# Patient Record
Sex: Male | Born: 1960
Health system: Southern US, Community
[De-identification: ages and names within clinical notes are randomized; demographics above are authoritative.]

## PROBLEM LIST (undated history)

## (undated) DIAGNOSIS — G4730 Sleep apnea, unspecified: Secondary | ICD-10-CM

## (undated) DIAGNOSIS — E785 Hyperlipidemia, unspecified: Secondary | ICD-10-CM

## (undated) DIAGNOSIS — I10 Essential (primary) hypertension: Secondary | ICD-10-CM

## (undated) DIAGNOSIS — I4891 Unspecified atrial fibrillation: Secondary | ICD-10-CM

## (undated) HISTORY — DX: Hyperlipidemia, unspecified: E78.5

## (undated) HISTORY — PX: OTHER SURGICAL HISTORY: SHX169

## (undated) HISTORY — DX: Unspecified atrial fibrillation: I48.91

---

## 2003-04-27 ENCOUNTER — Ambulatory Visit (HOSPITAL_COMMUNITY): Admission: RE | Admit: 2003-04-27 | Discharge: 2003-04-27 | Payer: Self-pay | Admitting: Gastroenterology

## 2004-05-13 ENCOUNTER — Encounter: Admission: RE | Admit: 2004-05-13 | Discharge: 2004-05-13 | Payer: Self-pay | Admitting: Neurology

## 2004-08-21 ENCOUNTER — Encounter: Admission: RE | Admit: 2004-08-21 | Discharge: 2004-09-16 | Payer: Self-pay | Admitting: Neurology

## 2004-10-13 ENCOUNTER — Encounter: Admission: RE | Admit: 2004-10-13 | Discharge: 2005-01-11 | Payer: Self-pay | Admitting: Otolaryngology

## 2005-02-08 ENCOUNTER — Ambulatory Visit (HOSPITAL_COMMUNITY): Admission: RE | Admit: 2005-02-08 | Discharge: 2005-02-08 | Payer: Self-pay | Admitting: Orthopedic Surgery

## 2005-05-15 ENCOUNTER — Ambulatory Visit (HOSPITAL_COMMUNITY): Admission: RE | Admit: 2005-05-15 | Discharge: 2005-05-15 | Payer: Self-pay | Admitting: Orthopedic Surgery

## 2007-03-02 ENCOUNTER — Ambulatory Visit (HOSPITAL_COMMUNITY): Admission: RE | Admit: 2007-03-02 | Discharge: 2007-03-02 | Payer: Self-pay | Admitting: Specialist

## 2007-04-05 ENCOUNTER — Encounter: Admission: RE | Admit: 2007-04-05 | Discharge: 2007-04-05 | Payer: Self-pay | Admitting: Specialist

## 2008-03-09 ENCOUNTER — Ambulatory Visit: Payer: Self-pay | Admitting: Vascular Surgery

## 2008-03-09 ENCOUNTER — Encounter (INDEPENDENT_AMBULATORY_CARE_PROVIDER_SITE_OTHER): Payer: Self-pay | Admitting: Family Medicine

## 2008-03-09 ENCOUNTER — Ambulatory Visit: Admission: RE | Admit: 2008-03-09 | Discharge: 2008-03-09 | Payer: Self-pay | Admitting: Family Medicine

## 2008-05-22 ENCOUNTER — Encounter: Admission: RE | Admit: 2008-05-22 | Discharge: 2008-05-22 | Payer: Self-pay | Admitting: Endocrinology

## 2008-08-10 ENCOUNTER — Ambulatory Visit (HOSPITAL_COMMUNITY): Admission: RE | Admit: 2008-08-10 | Discharge: 2008-08-10 | Payer: Self-pay | Admitting: Internal Medicine

## 2009-06-15 IMAGING — CR DG ORBITS FOR FOREIGN BODY
2 series · 2 of 2 positions shown · non-contrast
Comparison: none

CLINICAL DATA: Pre-MRI.  History of working with metal.  
 ORBITS ? 2 VIEW:

[view not recorded (1 of 2)]
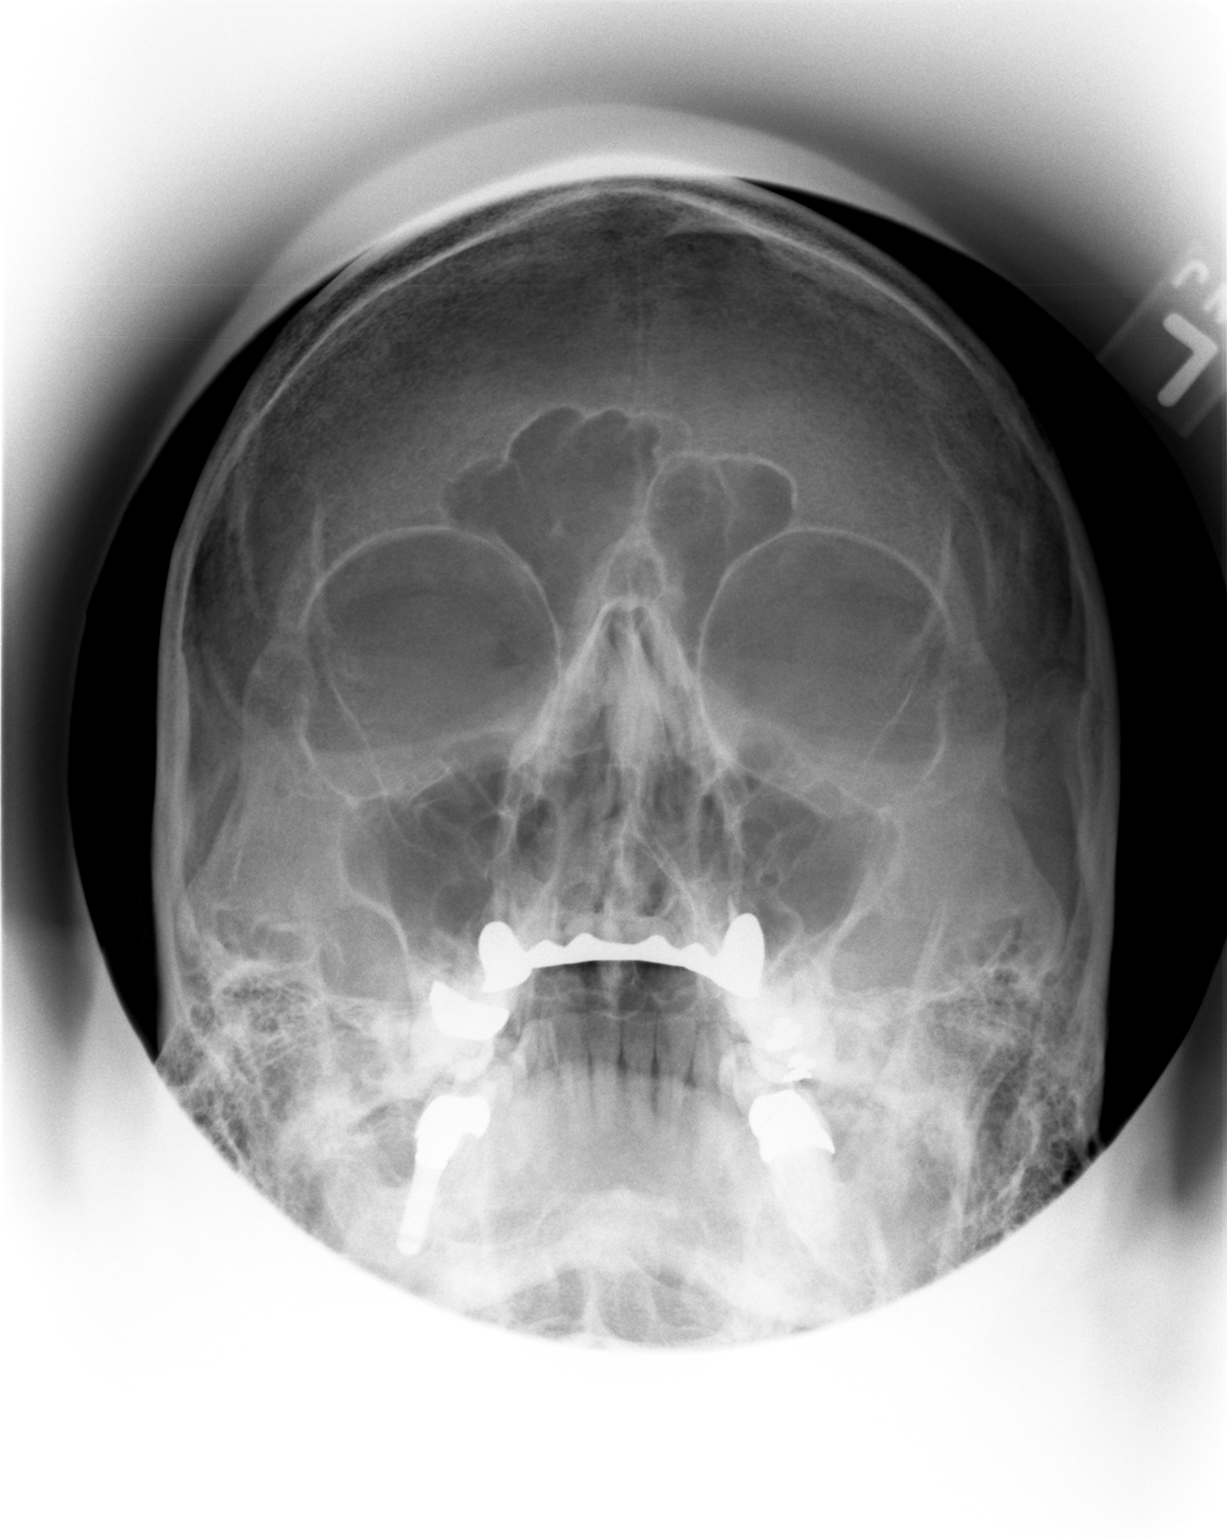

[view not recorded (2 of 2)]
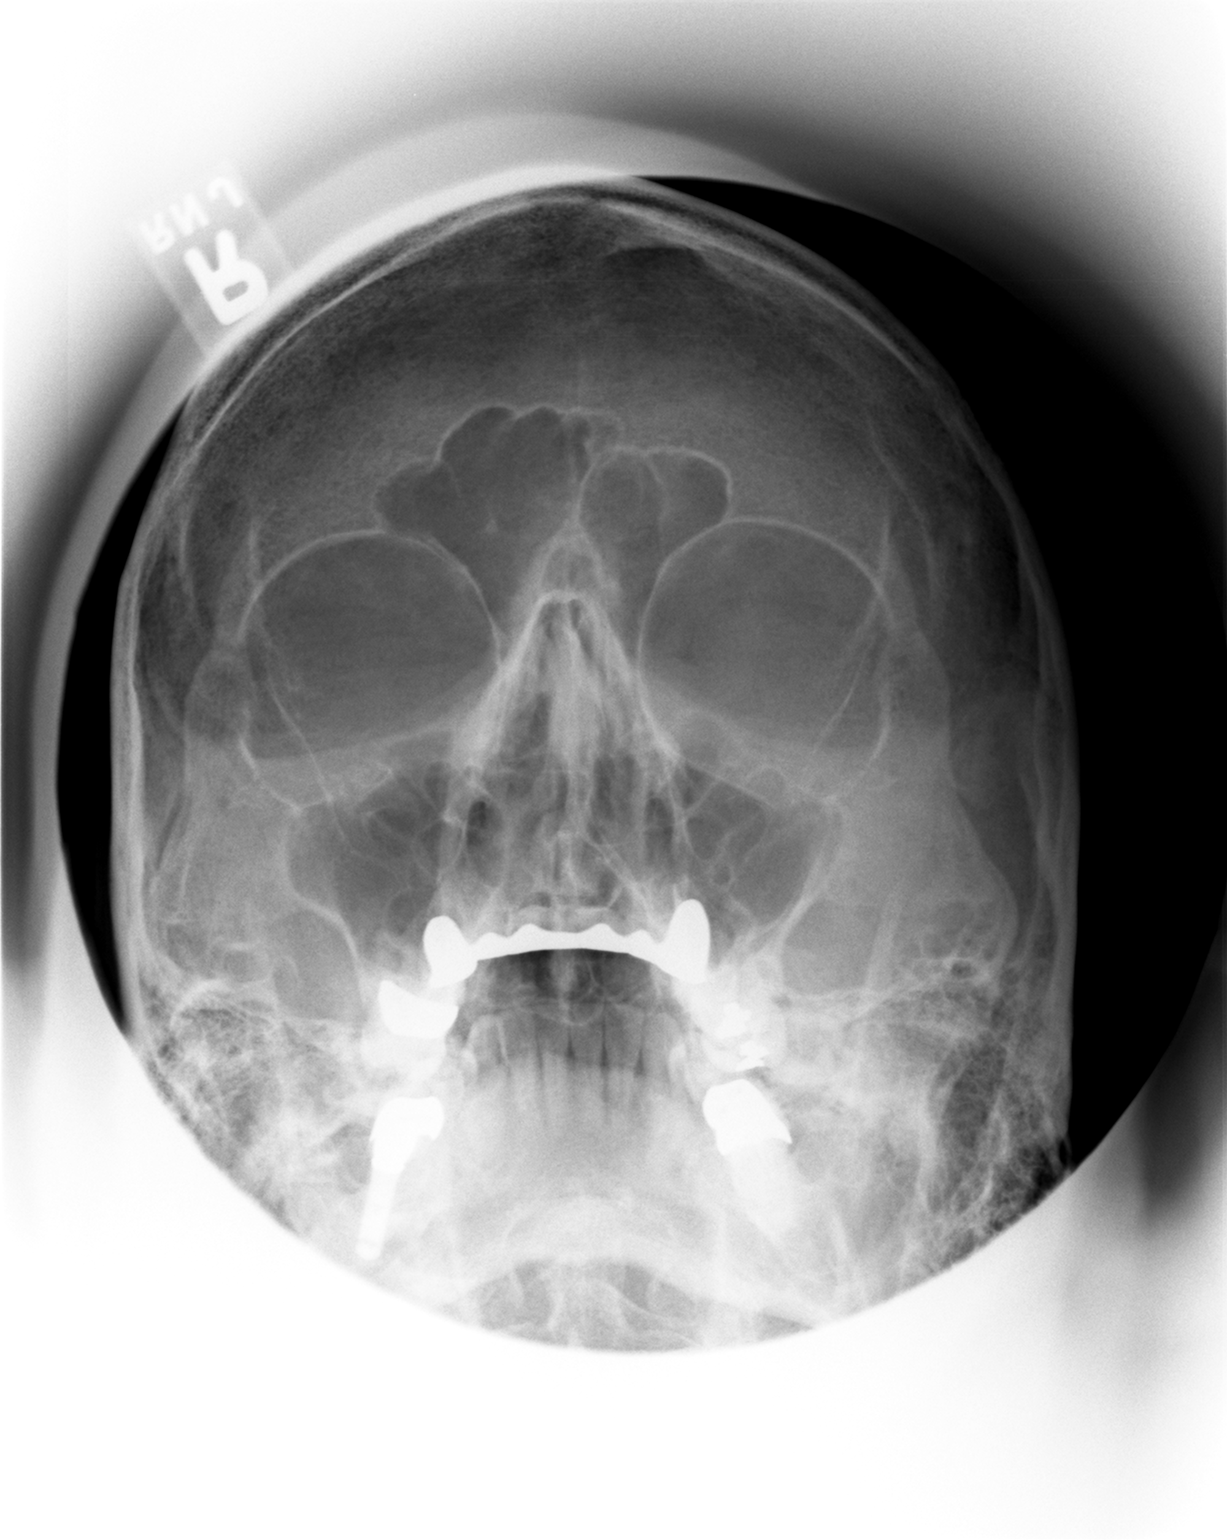

[2 of 2 positions shown; findings below may reference images not displayed]

FINDINGS: There is no evidence of metallic foreign body within the orbits.  No significant bone abnormality identified.
IMPRESSION: No evidence of metallic foreign body within the orbits.

## 2010-10-10 NOTE — Op Note (Signed)
NAMETOURE, EDMONDS                         ACCOUNT NO.:  [id number]   MEDICAL RECORD NO.:  [id number]                   PATIENT TYPE:  AMB   LOCATION:  ENDO                                 FACILITY:  [facility]   PHYSICIAN:  Danise Edge, M.D.                DATE OF BIRTH:  1960/06/26   DATE OF PROCEDURE:  04/28/2003  DATE OF DISCHARGE:                                 OPERATIVE REPORT   PROCEDURE:  Esophagogastroduodenoscopy.   INDICATIONS FOR PROCEDURE:  Mr. Todd Jacobson is a 50 year old male born  Feb 17, 1961.  Mr. Todd Jacobson has atypical chest pain.  He denies nausea,  vomiting, heartburn, indigestion, dysphagia or odynophagia.  He has been on  proton pump inhibitor therapy which has not influenced the frequency or  intensity of his atypical chest pain.  He reports no associated dyspnea or  cough.  He is an avid Counselling psychologist and runner.  He participates in a couple of  triathlons per year.  His chest pain does not appear to impair his physical  activity.   ENDOSCOPIST:  Danise Edge, M.D.   PREMEDICATION:  Versed 7.5 mg, Demerol 50 mg.   DESCRIPTION OF PROCEDURE:  After obtaining informed consent, Mr. Todd Jacobson was  placed in the left lateral decubitus position. I administered intravenous  Demerol and intravenous Versed to achieve conscious sedation for the  procedure. The patient's blood pressure, oxygen saturation and cardiac  rhythm were monitored throughout the procedure and documented in the medical  record.   The Olympus gastroscope was passed through the posterior hypopharynx into  the proximal esophagus without difficulty. The hypopharynx, larynx and vocal  cords appeared normal.   ESOPHAGOSCOPY:  Endoscopic appearance of the proximal, mid and lower  segments of the esophageal mucosa appeared normal.  The squamocolumnar  junction and the esophagogastric junction are noted at approximately 42-43  cm from the incisor teeth.  There was no endoscopic evidence for the  presence of erosive esophagitis, esophageal mucosal scarring or Barrett's  esophagus.   GASTROSCOPY:  Retroflexed view of the gastric cardia and fundus was normal.  The gastric body, antrum and pylorus appear normal.   DUODENOSCOPY:  The duodenal bulb, mid duodenum and distal duodenum appear  normal.   ASSESSMENT:  Normal esophagogastroduodenoscopy.                                               Danise Edge, M.D.    MJ/MEDQ  D:  04/27/2003  T:  04/28/2003  Job:  [id number]   cc:   Dario Guardian, M.D.  [address]  [place]  [place] [address]  Fax: [phone]

## 2013-03-06 ENCOUNTER — Other Ambulatory Visit: Payer: Self-pay | Admitting: Family Medicine

## 2013-03-09 ENCOUNTER — Other Ambulatory Visit: Payer: Self-pay

## 2013-03-13 ENCOUNTER — Other Ambulatory Visit: Payer: Self-pay

## 2013-03-14 ENCOUNTER — Ambulatory Visit
Admission: RE | Admit: 2013-03-14 | Discharge: 2013-03-14 | Disposition: A | Discharge status: Home / Self Care | Payer: 59 | Source: Ambulatory Visit | Attending: Family Medicine | Admitting: Family Medicine

## 2014-08-28 ENCOUNTER — Ambulatory Visit
Admission: RE | Admit: 2014-08-28 | Discharge: 2014-08-28 | Disposition: A | Discharge status: Home / Self Care | Payer: 59 | Source: Ambulatory Visit | Attending: Cardiology | Admitting: Cardiology

## 2014-08-28 ENCOUNTER — Other Ambulatory Visit: Payer: Self-pay | Admitting: Cardiology

## 2014-08-28 DIAGNOSIS — R079 Chest pain, unspecified: Secondary | ICD-10-CM

## 2016-02-13 ENCOUNTER — Ambulatory Visit (HOSPITAL_COMMUNITY)
Admission: RE | Admit: 2016-02-13 | Discharge: 2016-02-13 | Disposition: A | Discharge status: Home / Self Care | Payer: 59 | Source: Ambulatory Visit | Attending: Orthopedic Surgery | Admitting: Orthopedic Surgery

## 2016-02-13 ENCOUNTER — Other Ambulatory Visit (HOSPITAL_COMMUNITY): Payer: Self-pay | Admitting: Orthopedic Surgery

## 2016-02-13 DIAGNOSIS — Z01818 Encounter for other preprocedural examination: Secondary | ICD-10-CM | POA: Diagnosis not present

## 2016-02-13 DIAGNOSIS — M545 Low back pain: Secondary | ICD-10-CM

## 2016-07-15 ENCOUNTER — Other Ambulatory Visit: Payer: Self-pay | Admitting: Gastroenterology

## 2016-09-04 ENCOUNTER — Encounter (HOSPITAL_COMMUNITY): Payer: Self-pay | Admitting: *Deleted

## 2016-09-08 ENCOUNTER — Encounter (HOSPITAL_COMMUNITY)
Admission: RE | Disposition: A | Discharge status: Home / Self Care | Payer: Self-pay | Source: Ambulatory Visit | Attending: Gastroenterology

## 2016-09-08 ENCOUNTER — Ambulatory Visit (HOSPITAL_COMMUNITY): Payer: 59 | Admitting: Certified Registered Nurse Anesthetist

## 2016-09-08 ENCOUNTER — Encounter (HOSPITAL_COMMUNITY): Payer: Self-pay | Admitting: Certified Registered Nurse Anesthetist

## 2016-09-08 ENCOUNTER — Ambulatory Visit (HOSPITAL_COMMUNITY)
Admission: RE | Admit: 2016-09-08 | Discharge: 2016-09-08 | Disposition: A | Discharge status: Home / Self Care | Payer: 59 | Source: Ambulatory Visit | Attending: Gastroenterology | Admitting: Gastroenterology

## 2016-09-08 DIAGNOSIS — Z800 Family history of malignant neoplasm of digestive organs: Secondary | ICD-10-CM | POA: Diagnosis not present

## 2016-09-08 DIAGNOSIS — G4733 Obstructive sleep apnea (adult) (pediatric): Secondary | ICD-10-CM | POA: Diagnosis not present

## 2016-09-08 DIAGNOSIS — M1990 Unspecified osteoarthritis, unspecified site: Secondary | ICD-10-CM | POA: Insufficient documentation

## 2016-09-08 DIAGNOSIS — E7800 Pure hypercholesterolemia, unspecified: Secondary | ICD-10-CM | POA: Diagnosis not present

## 2016-09-08 DIAGNOSIS — Z1211 Encounter for screening for malignant neoplasm of colon: Secondary | ICD-10-CM | POA: Insufficient documentation

## 2016-09-08 DIAGNOSIS — I10 Essential (primary) hypertension: Secondary | ICD-10-CM | POA: Diagnosis not present

## 2016-09-08 HISTORY — DX: Sleep apnea, unspecified: G47.30

## 2016-09-08 HISTORY — PX: COLONOSCOPY WITH PROPOFOL: SHX5780

## 2016-09-08 HISTORY — DX: Essential (primary) hypertension: I10

## 2016-09-08 SURGERY — COLONOSCOPY WITH PROPOFOL
Anesthesia: Monitor Anesthesia Care

## 2016-09-08 MED ORDER — PROPOFOL 500 MG/50ML IV EMUL
INTRAVENOUS | Status: DC | PRN
  Administered 2016-09-08: 125 ug/kg/min via INTRAVENOUS

## 2016-09-08 MED ORDER — SODIUM CHLORIDE 0.9 % IV SOLN: INTRAVENOUS | Status: DC

## 2016-09-08 MED ORDER — LACTATED RINGERS IV SOLN: INTRAVENOUS | Status: DC

## 2016-09-08 MED ORDER — LACTATED RINGERS IV SOLN
INTRAVENOUS | Status: DC
  Administered 2016-09-08: 09:00:00 via INTRAVENOUS

## 2016-09-08 MED ORDER — PROPOFOL 500 MG/50ML IV EMUL
INTRAVENOUS | Status: DC | PRN
  Administered 2016-09-08: 60 mg via INTRAVENOUS

## 2016-09-08 SURGICAL SUPPLY — 22 items

## 2016-09-08 NOTE — H&P (Signed)
Procedure: Screening colonoscopy. Normal screening colonoscopy was performed on 11/03/2010. Mother was diagnosed with colon cancer before age 57.  History: The patient is a 56 year old male born [birth year]. He is scheduled to undergo a screening colonoscopy today.  Past medical history: Hypertension. Hypercholesterolemia. Obstructive sleep apnea. Osteoarthritis of the right hip.  Family history: Mother was diagnosed with colon cancer in her 38s.  Medication allergies: Amlodipine and OTC antihistamines.  Exam: The patient is alert and lying comfortably on the endoscopy stretcher. Abdomen is soft and nontender to palpation. Lungs are clear to auscultation. Cardiac exam reveals a regular rhythm.  Plan: Proceed with screening colonoscopy

## 2016-09-08 NOTE — Transfer of Care (Signed)
Immediate Anesthesia Transfer of Care Note  Patient: Todd Jacobson  Procedure(s) Performed: Procedure(s): COLONOSCOPY WITH PROPOFOL (N/A)  Patient Location: PACU  Anesthesia Type:MAC  Level of Consciousness: sedated, patient cooperative and responds to stimulation  Airway & Oxygen Therapy: Patient Spontanous Breathing and Patient connected to face mask oxygen  Post-op Assessment: Report given to RN and Post -op Vital signs reviewed and stable  Post vital signs: Reviewed and stable  Last Vitals:  Vitals:   09/08/16 0855 09/08/16 0940  BP: (!) 146/95   Pulse: 68 62  Resp: 18 19  Temp: 36.6 C     Last Pain:  Vitals:   09/08/16 0940  TempSrc: Oral         Complications: No apparent anesthesia complications

## 2016-09-08 NOTE — Anesthesia Postprocedure Evaluation (Signed)
Anesthesia Post Note  Patient: Todd Jacobson  Procedure(s) Performed: Procedure(s) (LRB): COLONOSCOPY WITH PROPOFOL (N/A)  Patient location during evaluation: PACU Anesthesia Type: MAC Level of consciousness: awake and alert Pain management: pain level controlled Vital Signs Assessment: post-procedure vital signs reviewed and stable Respiratory status: spontaneous breathing, nonlabored ventilation, respiratory function stable and patient connected to nasal cannula oxygen Cardiovascular status: stable and blood pressure returned to baseline Anesthetic complications: no       Last Vitals:  Vitals:   09/08/16 1000 09/08/16 1010  BP: (!) 137/92 (!) 135/94  Pulse: 60 (!) 53  Resp: 14 19  Temp:      Last Pain:  Vitals:   09/08/16 0940  TempSrc: Oral                 [name]

## 2016-09-08 NOTE — Op Note (Signed)
[facility] Patient Name: Todd Jacobson Procedure Date: 09/08/2016 MRN: [id number] Attending MD: Garlan Fair , MD Date of Birth: Feb 24, 1961 CSN: [id number] Age: 56 Admit Type: Outpatient Procedure:                Colonoscopy Indications:              Screening in patient at increased risk: Colorectal                            cancer in mother before age 41 Providers:                Garlan Fair, MD, Zenon Mayo, RN, Cherylynn Ridges, Technician, Herbie Drape, CRNA Referring MD:              Medicines:                Propofol per Anesthesia Complications:            No immediate complications. Estimated Blood Loss:     Estimated blood loss: none. Procedure:                Pre-Anesthesia Assessment:                           - Prior to the procedure, a History and Physical                            was performed, and patient medications and                            allergies were reviewed. The patient's tolerance of                            previous anesthesia was also reviewed. The risks                            and benefits of the procedure and the sedation                            options and risks were discussed with the patient.                            All questions were answered, and informed consent                            was obtained. Prior Anticoagulants: The patient has                            taken aspirin, last dose was 1 day prior to                            procedure. ASA Grade Assessment: II - A patient  with mild systemic disease. After reviewing the                            risks and benefits, the patient was deemed in                            satisfactory condition to undergo the procedure.                           After obtaining informed consent, the colonoscope                            was passed under direct vision. Throughout the   procedure, the patient's blood pressure, pulse, and                            oxygen saturations were monitored continuously. The                            EC-[id number] ([id number]) scope was introduced through                            the anus and advanced to the the cecum, identified                            by appendiceal orifice and ileocecal valve. The                            colonoscopy was performed without difficulty. The                            patient tolerated the procedure well. The quality                            of the bowel preparation was good. The terminal                            ileum, the ileocecal valve, the appendiceal orifice                            and the rectum were photographed. Scope In: 9:18:48 AM Scope Out: 9:33:51 AM Scope Withdrawal Time: 0 hours 9 minutes 22 seconds  Total Procedure Duration: 0 hours 15 minutes 3 seconds  Findings:      The perianal and digital rectal examinations were normal.      The entire examined colon appeared normal. Impression:               - The entire examined colon is normal.                           - No specimens collected. Moderate Sedation:      N/A- Per Anesthesia Care Recommendation:           - Patient has a contact number available for  emergencies. The signs and symptoms of potential                            delayed complications were discussed with the                            patient. Return to normal activities tomorrow.                            Written discharge instructions were provided to the                            patient.                           - Repeat colonoscopy in 5 years for screening                            purposes.                           - Resume previous diet.                           - Continue present medications. Procedure Code(s):        --- Professional ---                           W2993, Colorectal cancer screening; colonoscopy on                             individual at high risk Diagnosis Code(s):        --- Professional ---                           Z80.0, Family history of malignant neoplasm of                            digestive organs CPT copyright 2016 American [employer] Association. All rights reserved. The codes documented in this report are preliminary and upon coder review may  be revised to meet current compliance requirements. Earle Gell, MD Garlan Fair, MD 09/08/2016 9:40:54 AM This report has been signed electronically. Number of Addenda: 0

## 2016-09-08 NOTE — Anesthesia Preprocedure Evaluation (Signed)
Anesthesia Evaluation  Patient identified by MRN, date of birth, ID band Patient awake    Reviewed: Allergy & Precautions, H&P , NPO status , Patient's Chart, lab work & pertinent test results  Airway Mallampati: II   Neck ROM: full    Dental   Pulmonary sleep apnea ,    breath sounds clear to auscultation       Cardiovascular hypertension,  Rhythm:regular Rate:Normal     Neuro/Psych    GI/Hepatic   Endo/Other    Renal/GU      Musculoskeletal   Abdominal   Peds  Hematology   Anesthesia Other Findings   Reproductive/Obstetrics                             Anesthesia Physical Anesthesia Plan  ASA: II  Anesthesia Plan: MAC   Post-op Pain Management:    Induction: Intravenous  Airway Management Planned: Simple Face Mask  Additional Equipment:   Intra-op Plan:   Post-operative Plan:   Informed Consent: I have reviewed the patients History and Physical, chart, labs and discussed the procedure including the risks, benefits and alternatives for the proposed anesthesia with the patient or authorized representative who has indicated his/her understanding and acceptance.     Plan Discussed with: CRNA, Anesthesiologist and Surgeon  Anesthesia Plan Comments:         Anesthesia Quick Evaluation

## 2016-09-08 NOTE — Discharge Instructions (Signed)
YOU HAD AN ENDOSCOPIC PROCEDURE TODAY: Refer to the procedure report and other information in the discharge instructions given to you for any specific questions about what was found during the examination. If this information does not answer your questions, please call Dr. Wynetta Emery office at [phone] to clarify.   YOU SHOULD EXPECT: Some feelings of bloating in the abdomen. Passage of more gas than usual. Walking can help get rid of the air that was put into your GI tract during the procedure and reduce the bloating. If you had a lower endoscopy (such as a colonoscopy or flexible sigmoidoscopy) you may notice spotting of blood in your stool or on the toilet paper. Some abdominal soreness may be present for a day or two, also.  DIET: Your first meal following the procedure should be a light meal and then it is ok to progress to your normal diet. A half-sandwich or bowl of soup is an example of a good first meal. Heavy or fried foods are harder to digest and may make you feel nauseous or bloated. Drink plenty of fluids but you should avoid alcoholic beverages for 24 hours. If you had a esophageal dilation, please see attached instructions for diet.   ACTIVITY: Your care partner should take you home directly after the procedure. You should plan to take it easy, moving slowly for the rest of the day. You can resume normal activity the day after the procedure however YOU SHOULD NOT DRIVE, use power tools, machinery or perform tasks that involve climbing or major physical exertion for 24 hours (because of the sedation medicines used during the test).   SYMPTOMS TO REPORT IMMEDIATELY: A gastroenterologist can be reached at any hour. Please call [phone] for any of the following symptoms:  Following lower endoscopy (colonoscopy, flexible sigmoidoscopy) Excessive amounts of blood in the stool  Significant tenderness, worsening of abdominal pains  Swelling of the abdomen that is new, acute  Fever of 100  or higher  Following upper endoscopy (EGD, EUS, ERCP, esophageal dilation) Vomiting of blood or coffee ground material  New, significant abdominal pain  New, significant chest pain or pain under the shoulder blades  Painful or persistently difficult swallowing  New shortness of breath  Black, tarry-looking or red, bloody stools  FOLLOW UP:  If any biopsies were taken you will be contacted by phone or by letter within the next 1-3 weeks. Call [phone]  if you have not heard about the biopsies in 3 weeks.  Please also call with any specific questions about appointments or follow up tests.  YOU HAD AN ENDOSCOPIC PROCEDURE TODAY: Refer to the procedure report and other information in the discharge instructions given to you for any specific questions about what was found during the examination. If this information does not answer your questions, please call Dr. Wynetta Emery office at [phone] to clarify.   YOU SHOULD EXPECT: Some feelings of bloating in the abdomen. Passage of more gas than usual. Walking can help get rid of the air that was put into your GI tract during the procedure and reduce the bloating. If you had a lower endoscopy (such as a colonoscopy or flexible sigmoidoscopy) you may notice spotting of blood in your stool or on the toilet paper. Some abdominal soreness may be present for a day or two, also.  DIET: Your first meal following the procedure should be a light meal and then it is ok to progress to your normal diet. A half-sandwich or bowl of soup is an example  of a good first meal. Heavy or fried foods are harder to digest and may make you feel nauseous or bloated. Drink plenty of fluids but you should avoid alcoholic beverages for 24 hours. If you had a esophageal dilation, please see attached instructions for diet.   ACTIVITY: Your care partner should take you home directly after the procedure. You should plan to take it easy, moving slowly for the rest of the day. You can  resume normal activity the day after the procedure however YOU SHOULD NOT DRIVE, use power tools, machinery or perform tasks that involve climbing or major physical exertion for 24 hours (because of the sedation medicines used during the test).   SYMPTOMS TO REPORT IMMEDIATELY: A gastroenterologist can be reached at any hour. Please call [phone] for any of the following symptoms:  Following lower endoscopy (colonoscopy, flexible sigmoidoscopy) Excessive amounts of blood in the stool  Significant tenderness, worsening of abdominal pains  Swelling of the abdomen that is new, acute  Fever of 100 or higher  Following upper endoscopy (EGD, EUS, ERCP, esophageal dilation) Vomiting of blood or coffee ground material  New, significant abdominal pain  New, significant chest pain or pain under the shoulder blades  Painful or persistently difficult swallowing  New shortness of breath  Black, tarry-looking or red, bloody stools  FOLLOW UP:  If any biopsies were taken you will be contacted by phone or by letter within the next 1-3 weeks. Call [phone]  if you have not heard about the biopsies in 3 weeks.  Please also call with any specific questions about appointments or follow up tests.

## 2016-09-09 ENCOUNTER — Encounter (HOSPITAL_COMMUNITY): Payer: Self-pay | Admitting: Gastroenterology

## 2016-10-28 ENCOUNTER — Other Ambulatory Visit: Payer: Self-pay

## 2016-10-28 ENCOUNTER — Encounter (HOSPITAL_COMMUNITY): Payer: Self-pay

## 2016-10-28 ENCOUNTER — Emergency Department (HOSPITAL_COMMUNITY): Payer: 59

## 2016-10-28 ENCOUNTER — Emergency Department (HOSPITAL_COMMUNITY)
Admission: EM | Admit: 2016-10-28 | Discharge: 2016-10-28 | Disposition: A | Discharge status: Home / Self Care | Payer: 59 | Attending: Emergency Medicine | Admitting: Emergency Medicine

## 2016-10-28 DIAGNOSIS — R002 Palpitations: Secondary | ICD-10-CM | POA: Diagnosis present

## 2016-10-28 DIAGNOSIS — I10 Essential (primary) hypertension: Secondary | ICD-10-CM | POA: Insufficient documentation

## 2016-10-28 DIAGNOSIS — Z79899 Other long term (current) drug therapy: Secondary | ICD-10-CM | POA: Insufficient documentation

## 2016-10-28 DIAGNOSIS — I4891 Unspecified atrial fibrillation: Secondary | ICD-10-CM | POA: Diagnosis not present

## 2016-10-28 LAB — CBC
HCT: 41.8 % (ref 39.0–52.0)
Hemoglobin: 14.6 g/dL (ref 13.0–17.0)
MCH: 31.7 pg (ref 26.0–34.0)
MCHC: 34.9 g/dL (ref 30.0–36.0)
MCV: 90.7 fL (ref 78.0–100.0)
PLATELETS: 247 10*3/uL (ref 150–400)
RBC: 4.61 MIL/uL (ref 4.22–5.81)
RDW: 12.8 % (ref 11.5–15.5)
WBC: 10.7 10*3/uL — AB (ref 4.0–10.5)

## 2016-10-28 LAB — BASIC METABOLIC PANEL
Anion gap: 10 (ref 5–15)
BUN: 14 mg/dL (ref 6–20)
CALCIUM: 9.2 mg/dL (ref 8.9–10.3)
CO2: 24 mmol/L (ref 22–32)
CREATININE: 1.16 mg/dL (ref 0.61–1.24)
Chloride: 103 mmol/L (ref 101–111)
Glucose, Bld: 100 mg/dL — ABNORMAL HIGH (ref 65–99)
Potassium: 3.2 mmol/L — ABNORMAL LOW (ref 3.5–5.1)
SODIUM: 137 mmol/L (ref 135–145)

## 2016-10-28 LAB — I-STAT TROPONIN, ED: Troponin i, poc: 0 ng/mL (ref 0.00–0.08)

## 2016-10-28 LAB — TSH: TSH: 4.847 u[IU]/mL — AB (ref 0.350–4.500)

## 2016-10-28 LAB — MAGNESIUM: MAGNESIUM: 2 mg/dL (ref 1.7–2.4)

## 2016-10-28 MED ORDER — HEPARIN (PORCINE) IN NACL 100-0.45 UNIT/ML-% IJ SOLN
1300.0000 [IU]/h | INTRAMUSCULAR | Status: DC
  Filled 2016-10-28: qty 250

## 2016-10-28 MED ORDER — RIVAROXABAN (XARELTO) EDUCATION KIT FOR AFIB PATIENTS
PACK | Freq: Once | Status: DC
  Filled 2016-10-28: qty 1

## 2016-10-28 MED ORDER — DILTIAZEM HCL 30 MG PO TABS: 30.0000 mg | ORAL_TABLET | Freq: Four times a day (QID) | ORAL | 0 refills | Status: DC

## 2016-10-28 MED ORDER — RIVAROXABAN 20 MG PO TABS
20.0000 mg | ORAL_TABLET | Freq: Once | ORAL | Status: AC
  Administered 2016-10-28: 20 mg via ORAL
  Filled 2016-10-28: qty 1

## 2016-10-28 MED ORDER — POTASSIUM CHLORIDE CRYS ER 20 MEQ PO TBCR: 40.0000 meq | EXTENDED_RELEASE_TABLET | Freq: Two times a day (BID) | ORAL | 0 refills | Status: DC

## 2016-10-28 MED ORDER — RIVAROXABAN 20 MG PO TABS: 20.0000 mg | ORAL_TABLET | Freq: Every day | ORAL | 0 refills | Status: DC

## 2016-10-28 MED ORDER — DILTIAZEM HCL 100 MG IV SOLR
5.0000 mg/h | INTRAVENOUS | Status: DC
  Administered 2016-10-28: 5 mg/h via INTRAVENOUS
  Filled 2016-10-28: qty 100

## 2016-10-28 MED ORDER — LACTATED RINGERS IV BOLUS (SEPSIS)
1000.0000 mL | Freq: Once | INTRAVENOUS | Status: AC
  Administered 2016-10-28: 1000 mL via INTRAVENOUS

## 2016-10-28 MED ORDER — DILTIAZEM HCL 30 MG PO TABS
30.0000 mg | ORAL_TABLET | Freq: Once | ORAL | Status: AC
  Administered 2016-10-28: 30 mg via ORAL
  Filled 2016-10-28: qty 1

## 2016-10-28 MED ORDER — POTASSIUM CHLORIDE CRYS ER 20 MEQ PO TBCR
40.0000 meq | EXTENDED_RELEASE_TABLET | Freq: Once | ORAL | Status: AC
  Administered 2016-10-28: 40 meq via ORAL
  Filled 2016-10-28: qty 2

## 2016-10-28 MED ORDER — SODIUM CHLORIDE 0.9 % IV BOLUS (SEPSIS)
1000.0000 mL | Freq: Once | INTRAVENOUS | Status: AC
  Administered 2016-10-28: 1000 mL via INTRAVENOUS

## 2016-10-28 MED ORDER — DILTIAZEM LOAD VIA INFUSION
10.0000 mg | Freq: Once | INTRAVENOUS | Status: AC
  Administered 2016-10-28: 10 mg via INTRAVENOUS
  Filled 2016-10-28: qty 10

## 2016-10-28 MED ORDER — HEPARIN BOLUS VIA INFUSION
4000.0000 [IU] | Freq: Once | INTRAVENOUS | Status: DC
  Filled 2016-10-28: qty 4000

## 2016-10-28 NOTE — Progress Notes (Signed)
ANTICOAGULATION CONSULT NOTE - Initial Consult  Pharmacy Consult for heparin  Indication: atrial fibrillation  No Known Allergies  Patient Measurements: Height: 5\' 10"  (177.8 cm) Weight: 195 lb (88.5 kg) IBW/kg (Calculated) : 73 Heparin Dosing Weight:  89 kg   Vital Signs: Temp: 98.4 F (36.9 C) (06/06 1855) Temp Source: Oral (06/06 1855) BP: 121/79 (06/06 1930) Pulse Rate: 50 (06/06 1930)  Labs:  Recent Labs  10/28/16 1930  HGB 14.6  HCT 41.8  PLT 247    CrCl cannot be calculated (No order found.).  Medical History: Past Medical History:  Diagnosis Date  . Hypertension   . Sleep apnea    Assessment: 56 yo male admitted with palpitations and found be in afib. Not on oral anticoagulation PTA. Pharmacy consulted to dose heparin. CBC stable and no overt s/s bleeding noted.   Goal of Therapy:  Heparin level 0.3-0.7 units/ml Monitor platelets by anticoagulation protocol: Yes   Plan:  Heparin 4000 units x1, then start infusion at 1300 units/hr Heparin level in 6 hours  Daily heparin level and CBC Monitor for s/s bleeding Follow-up long term [facility] plans   Argie Ramming, PharmD Pharmacy Resident  Pager [phone] 10/28/16 8:04 PM

## 2016-10-28 NOTE — ED Notes (Addendum)
Patient transported to X-ray 

## 2016-10-28 NOTE — ED Notes (Signed)
ED Provider at bedside. 

## 2016-10-28 NOTE — ED Notes (Signed)
Pt departed in NAD, refused use of wheelchair.  
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
## 2016-10-28 NOTE — ED Triage Notes (Signed)
[facility] where pt went after cutting grass at home today and having near syncope.  Pt c/o palpitations and feeling that heart was racing but no chest pain. No hx of afib.

## 2016-10-28 NOTE — ED Notes (Signed)
Ambulated in hallway. Pt walked without asstance and had smooth and steady gait.

## 2016-10-28 NOTE — Discharge Instructions (Signed)
Follow-up in the AFib clinic within 3 days Take the diltiazem as prescribed Drink plenty of water If you develop lightheadedness or low blood pressure, hold your diltiazem and seek medical attention

## 2016-11-02 ENCOUNTER — Encounter (HOSPITAL_COMMUNITY): Payer: Self-pay | Admitting: Nurse Practitioner

## 2016-11-02 ENCOUNTER — Ambulatory Visit (HOSPITAL_COMMUNITY)
Admission: RE | Admit: 2016-11-02 | Discharge: 2016-11-02 | Disposition: A | Discharge status: Home / Self Care | Payer: 59 | Source: Ambulatory Visit | Attending: Nurse Practitioner | Admitting: Nurse Practitioner

## 2016-11-02 VITALS — BP 110/72 | HR 70 | Ht 70.0 in | Wt 198.0 lb

## 2016-11-02 DIAGNOSIS — Z7982 Long term (current) use of aspirin: Secondary | ICD-10-CM | POA: Insufficient documentation

## 2016-11-02 DIAGNOSIS — I480 Paroxysmal atrial fibrillation: Secondary | ICD-10-CM

## 2016-11-02 DIAGNOSIS — Z79899 Other long term (current) drug therapy: Secondary | ICD-10-CM | POA: Insufficient documentation

## 2016-11-02 DIAGNOSIS — Z888 Allergy status to other drugs, medicaments and biological substances status: Secondary | ICD-10-CM | POA: Diagnosis not present

## 2016-11-02 DIAGNOSIS — Z7901 Long term (current) use of anticoagulants: Secondary | ICD-10-CM | POA: Diagnosis not present

## 2016-11-02 DIAGNOSIS — I4891 Unspecified atrial fibrillation: Secondary | ICD-10-CM | POA: Diagnosis not present

## 2016-11-02 DIAGNOSIS — I10 Essential (primary) hypertension: Secondary | ICD-10-CM | POA: Insufficient documentation

## 2016-11-02 MED ORDER — DILTIAZEM HCL 30 MG PO TABS: ORAL_TABLET | ORAL | 0 refills | Status: DC

## 2016-11-02 MED ORDER — DILTIAZEM HCL ER COATED BEADS 120 MG PO CP24: 120.0000 mg | ORAL_CAPSULE | Freq: Every day | ORAL | 6 refills | Status: DC

## 2016-11-02 NOTE — Patient Instructions (Signed)
Your physician has recommended you make the following change in your medication:  1)Stop aspirin while taking Xarelto -- resume once you have completed the prescription for Xarelto   2)Start Cardizem CD 120mg  once a day  Use the 30mg  tablets of cardizem only as needed for afib/palpitations
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
## 2016-11-12 ENCOUNTER — Ambulatory Visit (HOSPITAL_COMMUNITY)
Admission: RE | Admit: 2016-11-12 | Discharge: 2016-11-12 | Disposition: A | Discharge status: Home / Self Care | Payer: 59 | Source: Ambulatory Visit | Attending: Nurse Practitioner | Admitting: Nurse Practitioner

## 2016-11-12 DIAGNOSIS — I10 Essential (primary) hypertension: Secondary | ICD-10-CM | POA: Diagnosis not present

## 2016-11-12 DIAGNOSIS — I480 Paroxysmal atrial fibrillation: Secondary | ICD-10-CM

## 2016-11-12 NOTE — Progress Notes (Signed)
  Echocardiogram 2D Echocardiogram has been performed.  Abigial Newville 11/12/2016, 1:46 PM

## 2016-12-04 ENCOUNTER — Other Ambulatory Visit: Payer: Self-pay | Admitting: *Deleted

## 2016-12-04 MED ORDER — DILTIAZEM HCL ER COATED BEADS 120 MG PO CP24: 120.0000 mg | ORAL_CAPSULE | Freq: Every day | ORAL | 1 refills | Status: DC

## 2016-12-04 NOTE — Telephone Encounter (Signed)
Received fax from [facility] requesting refills on patients diltiazem.

## 2016-12-22 ENCOUNTER — Encounter (HOSPITAL_COMMUNITY): Payer: Self-pay | Admitting: Nurse Practitioner

## 2016-12-22 ENCOUNTER — Ambulatory Visit (HOSPITAL_COMMUNITY)
Admission: RE | Admit: 2016-12-22 | Discharge: 2016-12-22 | Disposition: A | Discharge status: Home / Self Care | Payer: 59 | Source: Ambulatory Visit | Attending: Nurse Practitioner | Admitting: Nurse Practitioner

## 2016-12-22 VITALS — BP 114/72 | HR 70 | Ht 70.0 in | Wt 200.6 lb

## 2016-12-22 DIAGNOSIS — I10 Essential (primary) hypertension: Secondary | ICD-10-CM | POA: Insufficient documentation

## 2016-12-22 DIAGNOSIS — I4891 Unspecified atrial fibrillation: Secondary | ICD-10-CM | POA: Diagnosis not present

## 2016-12-22 DIAGNOSIS — Z7982 Long term (current) use of aspirin: Secondary | ICD-10-CM | POA: Diagnosis not present

## 2016-12-22 DIAGNOSIS — R42 Dizziness and giddiness: Secondary | ICD-10-CM | POA: Diagnosis not present

## 2016-12-22 DIAGNOSIS — Z9889 Other specified postprocedural states: Secondary | ICD-10-CM | POA: Insufficient documentation

## 2016-12-22 DIAGNOSIS — I480 Paroxysmal atrial fibrillation: Secondary | ICD-10-CM

## 2016-12-22 DIAGNOSIS — Z79899 Other long term (current) drug therapy: Secondary | ICD-10-CM | POA: Insufficient documentation

## 2016-12-22 LAB — CBC
HEMATOCRIT: 42.6 % (ref 39.0–52.0)
Hemoglobin: 14.8 g/dL (ref 13.0–17.0)
MCH: 30.8 pg (ref 26.0–34.0)
MCHC: 34.7 g/dL (ref 30.0–36.0)
MCV: 88.8 fL (ref 78.0–100.0)
Platelets: 230 10*3/uL (ref 150–400)
RBC: 4.8 MIL/uL (ref 4.22–5.81)
RDW: 12.6 % (ref 11.5–15.5)
WBC: 8.3 10*3/uL (ref 4.0–10.5)

## 2016-12-22 LAB — BASIC METABOLIC PANEL
Anion gap: 10 (ref 5–15)
BUN: 12 mg/dL (ref 6–20)
CO2: 25 mmol/L (ref 22–32)
CREATININE: 1.1 mg/dL (ref 0.61–1.24)
Calcium: 9.4 mg/dL (ref 8.9–10.3)
Chloride: 100 mmol/L — ABNORMAL LOW (ref 101–111)
GFR calc non Af Amer: >60 mL/min (ref >60)
GLUCOSE: 96 mg/dL (ref 65–99)
Potassium: 3.4 mmol/L — ABNORMAL LOW (ref 3.5–5.1)
Sodium: 135 mmol/L (ref 135–145)

## 2016-12-22 LAB — TSH: TSH: 3.411 u[IU]/mL (ref 0.350–4.500)

## 2016-12-22 MED ORDER — AMLODIPINE BESYLATE 5 MG PO TABS: 5.0000 mg | ORAL_TABLET | Freq: Every day | ORAL | 3 refills | Status: DC
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
## 2016-12-22 NOTE — Patient Instructions (Signed)
STOP diltiazem  START Amlodipine 5 mg taking one tablet daily-this was sent to your pharmacy  You have an appt scheduled with Dr. Wynonia Lawman, Monday November 5th at 10:45 a.m. If you are unable to keep this appt, please call their office

## 2016-12-23 ENCOUNTER — Other Ambulatory Visit (HOSPITAL_COMMUNITY): Payer: Self-pay | Admitting: *Deleted

## 2016-12-23 LAB — T3: T3, Total: 121 ng/dL (ref 71–180)

## 2016-12-23 MED ORDER — POTASSIUM CHLORIDE ER 10 MEQ PO TBCR: 10.0000 meq | EXTENDED_RELEASE_TABLET | Freq: Every day | ORAL | 6 refills | Status: DC

## 2017-03-29 ENCOUNTER — Encounter: Payer: Self-pay | Admitting: Cardiology

## 2017-03-29 DIAGNOSIS — I10 Essential (primary) hypertension: Secondary | ICD-10-CM | POA: Insufficient documentation

## 2017-03-29 DIAGNOSIS — I480 Paroxysmal atrial fibrillation: Secondary | ICD-10-CM | POA: Insufficient documentation

## 2017-03-29 DIAGNOSIS — G4730 Sleep apnea, unspecified: Secondary | ICD-10-CM | POA: Insufficient documentation

## 2017-05-26 DIAGNOSIS — K219 Gastro-esophageal reflux disease without esophagitis: Secondary | ICD-10-CM | POA: Diagnosis not present

## 2017-05-26 DIAGNOSIS — I10 Essential (primary) hypertension: Secondary | ICD-10-CM | POA: Diagnosis not present

## 2017-05-26 DIAGNOSIS — Z0000 Encounter for general adult medical examination without abnormal findings: Secondary | ICD-10-CM | POA: Diagnosis not present

## 2017-05-26 DIAGNOSIS — Z23 Encounter for immunization: Secondary | ICD-10-CM | POA: Diagnosis not present

## 2017-05-26 DIAGNOSIS — Z125 Encounter for screening for malignant neoplasm of prostate: Secondary | ICD-10-CM | POA: Diagnosis not present

## 2017-05-26 DIAGNOSIS — E782 Mixed hyperlipidemia: Secondary | ICD-10-CM | POA: Diagnosis not present

## 2017-06-25 DIAGNOSIS — H538 Other visual disturbances: Secondary | ICD-10-CM | POA: Diagnosis not present

## 2017-06-28 DIAGNOSIS — H43812 Vitreous degeneration, left eye: Secondary | ICD-10-CM | POA: Diagnosis not present

## 2017-06-28 DIAGNOSIS — H25813 Combined forms of age-related cataract, bilateral: Secondary | ICD-10-CM | POA: Diagnosis not present

## 2017-07-12 DIAGNOSIS — H52223 Regular astigmatism, bilateral: Secondary | ICD-10-CM | POA: Diagnosis not present

## 2017-07-12 DIAGNOSIS — H524 Presbyopia: Secondary | ICD-10-CM | POA: Diagnosis not present

## 2017-07-12 DIAGNOSIS — H5213 Myopia, bilateral: Secondary | ICD-10-CM | POA: Diagnosis not present

## 2017-07-14 ENCOUNTER — Other Ambulatory Visit (HOSPITAL_COMMUNITY): Payer: Self-pay | Admitting: Nurse Practitioner

## 2017-08-17 DIAGNOSIS — C44319 Basal cell carcinoma of skin of other parts of face: Secondary | ICD-10-CM | POA: Diagnosis not present

## 2017-11-08 DIAGNOSIS — K219 Gastro-esophageal reflux disease without esophagitis: Secondary | ICD-10-CM | POA: Diagnosis not present

## 2017-11-08 DIAGNOSIS — E782 Mixed hyperlipidemia: Secondary | ICD-10-CM | POA: Diagnosis not present

## 2017-11-08 DIAGNOSIS — I10 Essential (primary) hypertension: Secondary | ICD-10-CM | POA: Diagnosis not present

## 2017-12-01 ENCOUNTER — Other Ambulatory Visit (HOSPITAL_COMMUNITY): Payer: Self-pay | Admitting: Nurse Practitioner

## 2017-12-08 DIAGNOSIS — Z86018 Personal history of other benign neoplasm: Secondary | ICD-10-CM | POA: Diagnosis not present

## 2017-12-08 DIAGNOSIS — Z85828 Personal history of other malignant neoplasm of skin: Secondary | ICD-10-CM | POA: Diagnosis not present

## 2017-12-08 DIAGNOSIS — L570 Actinic keratosis: Secondary | ICD-10-CM | POA: Diagnosis not present

## 2017-12-08 DIAGNOSIS — D225 Melanocytic nevi of trunk: Secondary | ICD-10-CM | POA: Diagnosis not present

## 2017-12-22 DIAGNOSIS — G4733 Obstructive sleep apnea (adult) (pediatric): Secondary | ICD-10-CM | POA: Diagnosis not present

## 2018-01-04 DIAGNOSIS — G4733 Obstructive sleep apnea (adult) (pediatric): Secondary | ICD-10-CM | POA: Diagnosis not present

## 2018-02-04 DIAGNOSIS — G4733 Obstructive sleep apnea (adult) (pediatric): Secondary | ICD-10-CM | POA: Diagnosis not present

## 2018-03-03 ENCOUNTER — Other Ambulatory Visit (HOSPITAL_COMMUNITY): Payer: Self-pay | Admitting: Nurse Practitioner

## 2018-03-06 DIAGNOSIS — G4733 Obstructive sleep apnea (adult) (pediatric): Secondary | ICD-10-CM | POA: Diagnosis not present

## 2018-03-14 DIAGNOSIS — Z23 Encounter for immunization: Secondary | ICD-10-CM | POA: Diagnosis not present

## 2018-03-23 DIAGNOSIS — G4733 Obstructive sleep apnea (adult) (pediatric): Secondary | ICD-10-CM | POA: Diagnosis not present

## 2018-03-28 ENCOUNTER — Telehealth: Payer: Self-pay | Admitting: Cardiology

## 2018-03-28 NOTE — Telephone Encounter (Signed)
none

## 2018-04-06 DIAGNOSIS — G4733 Obstructive sleep apnea (adult) (pediatric): Secondary | ICD-10-CM | POA: Diagnosis not present

## 2018-05-06 DIAGNOSIS — G4733 Obstructive sleep apnea (adult) (pediatric): Secondary | ICD-10-CM | POA: Diagnosis not present

## 2018-06-02 DIAGNOSIS — E782 Mixed hyperlipidemia: Secondary | ICD-10-CM | POA: Diagnosis not present

## 2018-06-02 DIAGNOSIS — Z0000 Encounter for general adult medical examination without abnormal findings: Secondary | ICD-10-CM | POA: Diagnosis not present

## 2018-06-05 ENCOUNTER — Other Ambulatory Visit (HOSPITAL_COMMUNITY): Payer: Self-pay | Admitting: Nurse Practitioner

## 2018-06-06 DIAGNOSIS — G4733 Obstructive sleep apnea (adult) (pediatric): Secondary | ICD-10-CM | POA: Diagnosis not present

## 2018-07-07 DIAGNOSIS — G4733 Obstructive sleep apnea (adult) (pediatric): Secondary | ICD-10-CM | POA: Diagnosis not present

## 2018-07-13 DIAGNOSIS — L570 Actinic keratosis: Secondary | ICD-10-CM | POA: Diagnosis not present

## 2018-07-13 DIAGNOSIS — D2272 Melanocytic nevi of left lower limb, including hip: Secondary | ICD-10-CM | POA: Diagnosis not present

## 2018-07-13 DIAGNOSIS — D2271 Melanocytic nevi of right lower limb, including hip: Secondary | ICD-10-CM | POA: Diagnosis not present

## 2018-07-13 DIAGNOSIS — D225 Melanocytic nevi of trunk: Secondary | ICD-10-CM | POA: Diagnosis not present

## 2018-07-21 DIAGNOSIS — H5213 Myopia, bilateral: Secondary | ICD-10-CM | POA: Diagnosis not present

## 2018-08-05 DIAGNOSIS — G4733 Obstructive sleep apnea (adult) (pediatric): Secondary | ICD-10-CM | POA: Diagnosis not present

## 2018-09-05 DIAGNOSIS — G4733 Obstructive sleep apnea (adult) (pediatric): Secondary | ICD-10-CM | POA: Diagnosis not present

## 2018-09-12 DIAGNOSIS — M25512 Pain in left shoulder: Secondary | ICD-10-CM | POA: Diagnosis not present

## 2018-09-12 DIAGNOSIS — M7542 Impingement syndrome of left shoulder: Secondary | ICD-10-CM | POA: Diagnosis not present

## 2018-10-05 DIAGNOSIS — G4733 Obstructive sleep apnea (adult) (pediatric): Secondary | ICD-10-CM | POA: Diagnosis not present

## 2021-08-05 ENCOUNTER — Other Ambulatory Visit: Payer: Self-pay | Admitting: Family Medicine

## 2021-08-05 DIAGNOSIS — Z8781 Personal history of (healed) traumatic fracture: Secondary | ICD-10-CM

## 2021-12-26 ENCOUNTER — Other Ambulatory Visit: Payer: Self-pay | Admitting: Family Medicine

## 2021-12-26 DIAGNOSIS — Z8781 Personal history of (healed) traumatic fracture: Secondary | ICD-10-CM

## 2022-06-24 ENCOUNTER — Ambulatory Visit
Admission: RE | Admit: 2022-06-24 | Discharge: 2022-06-24 | Disposition: A | Discharge status: Home / Self Care | Payer: 59 | Source: Ambulatory Visit | Attending: Family Medicine | Admitting: Family Medicine

## 2022-06-24 DIAGNOSIS — Z8781 Personal history of (healed) traumatic fracture: Secondary | ICD-10-CM

## 2022-08-04 ENCOUNTER — Ambulatory Visit: Payer: 59 | Admitting: Cardiology

## 2022-08-04 ENCOUNTER — Encounter: Payer: Self-pay | Admitting: Cardiology

## 2022-08-04 VITALS — BP 136/88 | HR 74 | Resp 16 | Ht 70.0 in | Wt 201.8 lb

## 2022-08-04 DIAGNOSIS — I10 Essential (primary) hypertension: Secondary | ICD-10-CM

## 2022-08-04 DIAGNOSIS — G4730 Sleep apnea, unspecified: Secondary | ICD-10-CM

## 2022-08-04 DIAGNOSIS — E782 Mixed hyperlipidemia: Secondary | ICD-10-CM

## 2022-08-04 DIAGNOSIS — F1010 Alcohol abuse, uncomplicated: Secondary | ICD-10-CM

## 2022-08-04 DIAGNOSIS — I480 Paroxysmal atrial fibrillation: Secondary | ICD-10-CM
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
## 2022-08-13 ENCOUNTER — Ambulatory Visit: Payer: 59

## 2022-08-13 DIAGNOSIS — I480 Paroxysmal atrial fibrillation: Secondary | ICD-10-CM

## 2022-08-27 NOTE — Progress Notes (Signed)
LMTCB

## 2022-08-28 NOTE — Progress Notes (Signed)
Called patient no answer left a vm to return the call back.

## 2022-09-01 NOTE — Progress Notes (Signed)
Called patient to inform him about his echo results patient understood.

## 2022-10-01 ENCOUNTER — Ambulatory Visit: Payer: 59 | Admitting: Cardiology

## 2022-10-01 ENCOUNTER — Encounter: Payer: Self-pay | Admitting: Cardiology

## 2022-10-01 VITALS — BP 135/87 | HR 71 | Resp 18 | Ht 70.0 in | Wt 204.8 lb

## 2022-10-01 DIAGNOSIS — I480 Paroxysmal atrial fibrillation: Secondary | ICD-10-CM
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
## 2023-09-30 ENCOUNTER — Ambulatory Visit: Payer: Self-pay | Admitting: Cardiology

## 2023-11-02 ENCOUNTER — Encounter: Payer: Self-pay | Admitting: Cardiology

## 2023-11-02 ENCOUNTER — Ambulatory Visit: Attending: Cardiology | Admitting: Cardiology

## 2023-11-02 VITALS — BP 127/77 | HR 76 | Resp 16 | Ht 70.0 in | Wt 206.0 lb

## 2023-11-02 DIAGNOSIS — I10 Essential (primary) hypertension: Secondary | ICD-10-CM | POA: Diagnosis not present

## 2023-11-02 DIAGNOSIS — E782 Mixed hyperlipidemia: Secondary | ICD-10-CM | POA: Diagnosis not present

## 2023-11-02 DIAGNOSIS — I480 Paroxysmal atrial fibrillation: Secondary | ICD-10-CM

## 2023-11-02 DIAGNOSIS — G4730 Sleep apnea, unspecified: Secondary | ICD-10-CM | POA: Diagnosis not present

## 2023-11-02 DIAGNOSIS — F1010 Alcohol abuse, uncomplicated: Secondary | ICD-10-CM

## 2023-11-02 NOTE — Progress Notes (Signed)
 Cardiology Office Note:  .   Date:  11/02/2023  ID:  Todd Jacobson, DOB 1961/02/06, MRN [id number] PCP:  Rae Bugler, MD  Former Cardiology Providers: Dr. Ros[facility] Providers Cardiologist:  Olinda Bertrand, DO , [facility] (established care 08/04/2022) Electrophysiologist:  None  Click to update primary MD,subspecialty MD or APP then REFRESH:1}    Chief Complaint  Patient presents with   Atrial Fibrillation    History of Present Illness: .   Todd Jacobson is a 63 y.o. Caucasian male whose past medical history and cardiovascular risk factors includes: Hypertension, hyperlipidemia, sleep apnea, GERD, paroxysmal atrial fibrillation.   Patient was referred to the practice for evaluation and management of his paroxysmal atrial fibrillation.  Diagnosed with A-fib in 2018 when he was exposed to hot weather for prolonged period of time. Since then he has not had any known/objective evidence of recurrence of atrial fibrillation or flutter. He was on diltiazem  for rate control strategy but did not tolerate it well and therefore it was transition back to Norvasc . He uses CPAP regularly.  Recommended to reduce alcohol consumption to no more than 2 standard beverages per day.  We also discussed monitoring for atrial fibrillation with modalities which include but not limited to Zio patch, implantable loop recorder, and smart watch technology which was FDA approved for A-fib detection.  Patient wanted to continue with conservative management.  Since last office visit patient denies any anginal chest pain or heart failure symptoms.  No hospitalizations or urgent care visits for cardiovascular reasons.  He has been compliant with his medical therapy.  No significant weight gain.  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 Home SBP ranges between <130 mmHg. Drinks 4-6 glasses of draft beer daily. Use a smart watch to monitor his rhythm.    Review  of Systems: .   Review of Systems  Cardiovascular:  Negative for chest pain, claudication, irregular heartbeat, leg swelling, near-syncope, orthopnea, palpitations, paroxysmal nocturnal dyspnea and syncope.  Respiratory:  Negative for shortness of breath.   Hematologic/Lymphatic: Negative for bleeding problem.    Studies Reviewed:   EKG: EKG Interpretation Date/Time:  Tuesday November 02 2023 08:45:32 EDT Ventricular Rate:  78 PR Interval:  142 QRS Duration:  90 QT Interval:  382 QTC Calculation: 435 R Axis:   46  Text Interpretation: Normal sinus rhythm Cannot rule out Inferior infarct , age undetermined ST & T wave abnormality, consider anterior ischemia When compared with ECG of 22-Dec-2016 14:06, T wave inversion now evident in Anterior leads Confirmed by Olinda Bertrand [phone]) on 11/02/2023 9:07:38 AM  Echocardiogram: 11/12/2016: LVEF 60-65%, grade 1 diastolic impairment, global longitudinal strain -21.9%, no significant valvular heart disease, estimated RAP 3 mmHg.   08/13/2022:  Normal LV systolic function with visual EF 60-65%. Left ventricle cavity is normal in size. Normal left ventricular wall thickness. Normal global wall motion. Normal diastolic filling pattern. Calculated EF 66%. Structurally normal tricuspid valve with trace regurgitation. No evidence of pulmonary hypertension. No prior available for comparison.   RADIOLOGY: NA  Risk Assessment/Calculations:   Click Here to Calculate/Change CHADS2VASc Score The patient's CHADS2-VASc score is 1, indicating a 0.6% annual risk of stroke.    Labs:    External Labs: Collected: 07/24/2022 provided by referring physician. Hemoglobin 14.2, hematocrit 41.9%. BUN 14, creatinine 1.04. Sodium 140, potassium 4.3, chloride 103, bicarb 29. AST 37, ALT 58, alkaline phosphatase 67. Total cholesterol 164, triglycerides 76, HDL 59, LDL 91,  non-HDL 105. TSH 2.81.  CBC with Diff Reviewed date:08/19/2023 05:57:03  PM Interpretation:Normal Performing Lab: Notes/Report: Testing Performed at: [facility], [address], [address], [place], [place] [address]  WBC 6.3 4.0-11.0 K/ul    RBC 4.68 4.20-5.80 M/uL    HGB 15.0 13.0-17.0 g/dL    HCT 60.4 54.0-98.1 %    MCV 93.5 80.0-94.0 fL    MCH 32.1 27.0-33.0 pg    MCHC 34.3 32.0-36.0 g/dL    RDW 19.1 47.8-29.5 %    PLT 241 150-400 K/uL    MPV 9.2 7.5-10.7 fL    NE% 63.8 43.3-71.9 %    LY% 22.6 16.8-43.5 %    MO% 11.4 4.6-12.4 %    EO% 1.5 0.0-7.8 %    BA% 0.7 0.0-1.0 %    NE# 4.0 1.9-7.2 K/uL    LY# 1.40 1.10-2.70 K/uL    MO# 0.7 0.3-0.8 K/uL    EO# 0.1 0.0-0.6 K/uL    BA# 0.0 0.0-0.1 K/uL    NRBC% 0.20      NRBC# 0.01      Vitamin D 25(OH) Total Reviewed date:01/26/2023 11:56:25 AM Interpretation: Performing Lab: Notes/Report: Testing Performed at: [facility], [address], [place], [place] [address]  25(OH) Vit D, Total 34.0 30.0-100.0 ng/mL    Lipid Panel w/reflex Reviewed date:08/19/2023 05:54:44 PM Interpretation:LDL 91 Performing Lab: Notes/Report: Testing Performed at: [facility], [address], [address], [place], [place] [address]  Cholesterol 169 <200 mg/dL    CHOL/HDL 2.7 4.6-9.6 Ratio    HDLD 62 30-70 mg/dL Values below 40 mg/dL indicate increased risk factor  Triglyceride 86 0-199 mg/dL    NHDL 295 2-841 mg/dL Range dependent upon risk factors.  LDL Chol Calc (NIH) 91 0-99 mg/dL    Comp Metabolic Panel Reviewed date:08/20/2023 09:03:35 AM Interpretation:*Elevated liver enzymes Performing Lab: Notes/Report: Testing Performed at: [facility], [address], [address], [place], [place] [address]  Glucose 91 70-99 mg/dL    BUN 14 1-02 mg/dL    Creatinine 7.25 3.66-4.40 mg/dl    [id number] 75 >63 calc In accordance with recommendations from NKF-ASN Task Force, [facility] has updated its eGFR calc to the 2021 CKD-EDI equation that estimates kidney function without a race variable;Stage 1 > 90 ML/Min plus  Albuminuria;Stage 2 60-89 ML/MIN;Stage 3 30-59 ML/MIN;Stage 4 15-29 ML/MIN;Stage 5 <15 ML/MIN  Sodium 140 136-145 mmol/L    Potassium 4.4 3.5-5.5 mmol/L    Chloride 104 98-107 mmol/L    CO2 26 22-32 mmol/L    Anion Gap 14.4 6.0-20.0 mmol/L    Calcium 9.6 8.6-10.3 mg/dL    CA-corrected 8.75 6.43-32.95 mg/dL    Protein, Total 7.0 1.8-8.4 g/dL    Albumin 4.8 1.6-6.0 g/dL    TBIL 0.9 6.3-0.1 mg/dL    ALP 75 60-109 U/L    AST 53 0-39 U/L    ALT 86 0-52 U/L    Thyroid  [facility] Reviewed date:08/19/2023 05:54:09 PM Interpretation:Normal Performing Lab: Notes/Report: Testing Performed at: [facility], [address], [place], [place] [address]  TSH 3.00 0.34-4.50 UlU/mL      Physical Exam:  Today's Vitals   11/02/23 0847  BP: 127/77  Pulse: 76  Resp: 16  SpO2: 97%  Weight: 206 lb (93.4 kg)  Height: 5\' 10"  (1.778 m)   Body mass index is 29.56 kg/m. Wt Readings from Last 3 Encounters:  11/02/23 206 lb (93.4 kg)  10/01/22 204 lb 12.8 oz (92.9 kg)  08/04/22 201 lb 12.8 oz (91.5  kg)    Physical Exam  Constitutional: No distress.  hemodynamically stable  Neck: No JVD present.  Cardiovascular: Normal rate, regular rhythm, S1 normal and S2 normal. Exam reveals no gallop, no S3 and no S4.  No murmur heard. Pulmonary/Chest: Effort normal and breath sounds normal. No stridor. He has no wheezes. He has no rales.  Musculoskeletal:        General: No edema.     Cervical back: Neck supple.  Skin: Skin is warm.      Impression:   ICD-10-CM   1. Paroxysmal atrial fibrillation (HCC)  I48.0 EKG 12-Lead    2. Benign hypertension  I10     3. Mixed hyperlipidemia  E78.2     4. Sleep apnea in adult  G47.30     5. Alcohol abuse  F10.10        Recommendation(s):  Paroxysmal atrial fibrillation (HCC) Rate control: N/A. Rhythm control: N/A. Thromboembolic prophylaxis: Aspirin [id number] score is 1-hypertension. Diagnosed in 2018.  No reoccurrence of A-fib  according to the patient. Since last office visit he has started using a smart watch to monitor his rhythm. Compliant with his CPAP on a regular basis. Still drinks 4-6 alcoholic beverages per day, recommended no more than 2/day Will continue to monitor.  Benign hypertension Office blood pressures are very well-controlled. Continue amlodipine  5 mg p.o. daily. Continue Benicar 20 mg p.o. daily  Mixed hyperlipidemia Currently on atorvastatin 20 mg p.o. daily.   He denies myalgia or other side effects. Outside labs independently reviewed and noted above for the reference.    Sleep apnea in adult Reemphasized importance of compliance with his CPAP  Alcohol abuse Currently drinks 4-6 beverages per day.  Recommended no more than 2/day.  Patient is EKG illustrates sinus rhythm with subtle ST-T changes in the anterior leads.  Clinically denies anginal chest pain or heart failure symptoms.  No change in physical endurance.  Since patient is asymptomatic we will hold off on stress test at this time.  Patient is aware of his EKG findings and if he does have change in symptoms he will call us  back sooner.  Discussed management of at least 2 chronic comorbid conditions, outside labs from [facility] 08/20/2023 independently reviewed, medications reconciled, reemphasize importance of complete alcohol cessation or to no more than 2 beverages per day, reviewed the results of the prior echo from March 2024.  Orders Placed:  Orders Placed This Encounter  Procedures   EKG 12-Lead   Final Medication List:   No orders of the defined types were placed in this encounter.   There are no discontinued medications.   Current Outpatient Medications:    amLODipine  (NORVASC ) 5 MG tablet, TAKE 1 TABLET BY MOUTH EVERY DAY, Disp: 90 tablet, Rfl: 0   aspirin EC 81 MG tablet, Take 81 mg by mouth daily., Disp: , Rfl:    atorvastatin (LIPITOR) 20 MG tablet, Take 20 mg by mouth every morning., Disp: , Rfl:     fexofenadine (ALLERGY 24-HR) 180 MG tablet, Take 180 mg by mouth daily., Disp: , Rfl:    ketotifen (ALAWAY) 0.025 % ophthalmic solution, Place 1 drop into both eyes 2 (two) times daily as needed (allergy eyes). , Disp: , Rfl:    milk thistle 175 MG tablet, Take 175 mg by mouth daily., Disp: , Rfl:    Multiple Vitamin (THERA) TABS, Take 1 tablet by mouth daily., Disp: , Rfl:    olmesartan (BENICAR) 20 MG tablet, Take 20 mg by  mouth daily., Disp: , Rfl:    pantoprazole (PROTONIX) 20 MG tablet, Take 20 mg by mouth daily., Disp: , Rfl:    psyllium (METAMUCIL) 58.6 % powder, Take 10 packets by mouth daily. 2 tablespoons each morning, Disp: , Rfl:   Consent:   NA  Disposition:   1 year follow-up sooner.  His questions and concerns were addressed to his satisfaction. He voices understanding of the recommendations provided during this encounter.    Signed, Awilda Bogus, Lbj [facility]  A Division of [facility] [address] [place], [place] [address]  [place], [place] [address] 11/02/2023 10:39 AM

## 2023-11-02 NOTE — Patient Instructions (Signed)
 Medication Instructions:  No medication changes were made at this visit. Continue current regimen.   *If you need a refill on your cardiac medications before your next appointment, please call your pharmacy*  Lab Work: None ordered today. If you have labs (blood work) drawn today and your tests are completely normal, you will receive your results only by: MyChart Message (if you have MyChart) OR A paper copy in the mail If you have any lab test that is abnormal or we need to change your treatment, we will call you to review the results.  Testing/Procedures: None ordered today.  Follow-Up: At [facility], you and your health needs are our priority.  As part of our continuing mission to provide you with exceptional heart care, our providers are all part of one team.  This team includes your primary Cardiologist (physician) and Advanced Practice Providers or APPs (Physician Assistants and Nurse Practitioners) who all work together to provide you with the care you need, when you need it.  Your next appointment:   1 year(s)  Provider:   Olinda Bertrand, DO

## 2024-06-28 ENCOUNTER — Other Ambulatory Visit: Payer: Self-pay

## 2024-06-28 ENCOUNTER — Encounter (HOSPITAL_BASED_OUTPATIENT_CLINIC_OR_DEPARTMENT_OTHER): Payer: Self-pay

## 2024-06-28 ENCOUNTER — Emergency Department (HOSPITAL_BASED_OUTPATIENT_CLINIC_OR_DEPARTMENT_OTHER)
Admission: EM | Admit: 2024-06-28 | Discharge: 2024-06-29 | Disposition: A | Attending: Emergency Medicine | Admitting: Emergency Medicine

## 2024-06-28 DIAGNOSIS — E876 Hypokalemia: Secondary | ICD-10-CM | POA: Insufficient documentation

## 2024-06-28 DIAGNOSIS — R197 Diarrhea, unspecified: Secondary | ICD-10-CM

## 2024-06-28 DIAGNOSIS — D72829 Elevated white blood cell count, unspecified: Secondary | ICD-10-CM | POA: Insufficient documentation

## 2024-06-28 DIAGNOSIS — Z7982 Long term (current) use of aspirin: Secondary | ICD-10-CM | POA: Insufficient documentation

## 2024-06-28 DIAGNOSIS — N179 Acute kidney failure, unspecified: Secondary | ICD-10-CM | POA: Insufficient documentation

## 2024-06-28 DIAGNOSIS — K529 Noninfective gastroenteritis and colitis, unspecified: Secondary | ICD-10-CM | POA: Insufficient documentation

## 2024-06-28 LAB — CBC
HCT: 41.2 % (ref 39.0–52.0)
Hemoglobin: 14.6 g/dL (ref 13.0–17.0)
MCH: 30.8 pg (ref 26.0–34.0)
MCHC: 35.4 g/dL (ref 30.0–36.0)
MCV: 86.9 fL (ref 80.0–100.0)
Platelets: 416 10*3/uL — ABNORMAL HIGH (ref 150–400)
RBC: 4.74 MIL/uL (ref 4.22–5.81)
RDW: 12.2 % (ref 11.5–15.5)
WBC: 19.7 10*3/uL — ABNORMAL HIGH (ref 4.0–10.5)
nRBC: 0 % (ref 0.0–0.2)

## 2024-06-28 LAB — COMPREHENSIVE METABOLIC PANEL WITH GFR
ALT: 61 U/L — ABNORMAL HIGH (ref 0–44)
AST: 35 U/L (ref 15–41)
Albumin: 4.5 g/dL (ref 3.5–5.0)
Alkaline Phosphatase: 60 U/L (ref 38–126)
Anion gap: 14 (ref 5–15)
BUN: 11 mg/dL (ref 8–23)
CO2: 22 mmol/L (ref 22–32)
Calcium: 9 mg/dL (ref 8.9–10.3)
Chloride: 100 mmol/L (ref 98–111)
Creatinine, Ser: 1.58 mg/dL — ABNORMAL HIGH (ref 0.61–1.24)
GFR, Estimated: 49 mL/min — ABNORMAL LOW
Glucose, Bld: 117 mg/dL — ABNORMAL HIGH (ref 70–99)
Potassium: 2.9 mmol/L — ABNORMAL LOW (ref 3.5–5.1)
Sodium: 136 mmol/L (ref 135–145)
Total Bilirubin: 0.8 mg/dL (ref 0.0–1.2)
Total Protein: 6.8 g/dL (ref 6.5–8.1)

## 2024-06-28 LAB — LIPASE, BLOOD: Lipase: 26 U/L (ref 11–51)

## 2024-06-28 MED ORDER — DIPHENOXYLATE-ATROPINE 2.5-0.025 MG PO TABS
2.0000 | ORAL_TABLET | Freq: Once | ORAL | Status: AC
  Administered 2024-06-28: 2 via ORAL
  Filled 2024-06-28: qty 2

## 2024-06-28 MED ORDER — LACTATED RINGERS IV BOLUS
1000.0000 mL | Freq: Once | INTRAVENOUS | Status: AC
  Administered 2024-06-28: 1000 mL via INTRAVENOUS

## 2024-06-28 NOTE — ED Triage Notes (Signed)
 Pt presents via POV c/o diarrhea for 5-6 weeks. Reports seen by both PCP and GI for same. Denies having c diff. Reports dx with insufficient pancreatic function and started on pancreatic enzyme replacement therapy yesterday per pt. Ambulatory to triage.

## 2024-06-29 ENCOUNTER — Emergency Department (HOSPITAL_BASED_OUTPATIENT_CLINIC_OR_DEPARTMENT_OTHER)

## 2024-06-29 LAB — URINALYSIS, ROUTINE W REFLEX MICROSCOPIC
Bilirubin Urine: NEGATIVE
Glucose, UA: NEGATIVE mg/dL
Hgb urine dipstick: NEGATIVE
Ketones, ur: NEGATIVE mg/dL
Leukocytes,Ua: NEGATIVE
Nitrite: NEGATIVE
Protein, ur: NEGATIVE mg/dL
Specific Gravity, Urine: <=1.005 (ref 1.005–1.030)
pH: 6.5 (ref 5.0–8.0)

## 2024-06-29 LAB — MAGNESIUM: Magnesium: 1.9 mg/dL (ref 1.7–2.4)

## 2024-06-29 MED ORDER — LACTATED RINGERS IV BOLUS
1000.0000 mL | Freq: Once | INTRAVENOUS | Status: AC
  Administered 2024-06-29: 1000 mL via INTRAVENOUS

## 2024-06-29 MED ORDER — POTASSIUM CHLORIDE CRYS ER 20 MEQ PO TBCR
40.0000 meq | EXTENDED_RELEASE_TABLET | Freq: Once | ORAL | Status: AC
  Administered 2024-06-29: 40 meq via ORAL
  Filled 2024-06-29: qty 2

## 2024-06-29 MED ORDER — IOHEXOL 300 MG/ML  SOLN
100.0000 mL | Freq: Once | INTRAMUSCULAR | Status: AC | PRN
  Administered 2024-06-29: 100 mL via INTRAVENOUS

## 2024-06-29 MED ORDER — ONDANSETRON 4 MG PO TBDP: 4.0000 mg | ORAL_TABLET | Freq: Three times a day (TID) | ORAL | 0 refills | Status: AC | PRN

## 2024-06-29 MED ORDER — MAGNESIUM SULFATE 2 GM/50ML IV SOLN: 2.0000 g | Freq: Once | INTRAVENOUS | Status: DC

## 2024-06-29 MED ORDER — DIPHENOXYLATE-ATROPINE 2.5-0.025 MG PO TABS: 2.0000 | ORAL_TABLET | Freq: Three times a day (TID) | ORAL | 0 refills | Status: AC | PRN

## 2024-06-29 MED ORDER — POTASSIUM CHLORIDE 10 MEQ/100ML IV SOLN
10.0000 meq | Freq: Once | INTRAVENOUS | Status: AC
  Administered 2024-06-29: 10 meq via INTRAVENOUS
  Filled 2024-06-29: qty 100

## 2024-06-29 MED ORDER — POTASSIUM CHLORIDE CRYS ER 20 MEQ PO TBCR: 40.0000 meq | EXTENDED_RELEASE_TABLET | Freq: Two times a day (BID) | ORAL | 0 refills | Status: AC

## 2024-06-29 NOTE — ED Provider Notes (Addendum)
 " Todd Jacobson EM[facility]Y DEPARTMENT AT [facility] Provider Note   CSN: [id number] Arrival date & time: 06/28/24  2215     Patient presents with: Diarrhea   Todd Jacobson is a 64 y.o. male.   60 male who presents ER today secondary to nearly 2 months for the diarrhea.  Has been following with GI was recently diagnosed with pancreatic insufficiency.  Started Creon couple days ago.  Patient states he has felt rundown, decreased appetite, nauseous, no energy.  Patient states he had 5-6 episodes of diarrhea today.  No blood.  No fevers.  No abdominal tenderness.  States he did probiotics for a few days but stopped them.  States he did Imodium but stopped it.  Tried fiber every day however now is only doing every other day.  Colonoscopy scheduled for the 10th.  I reviewed the records through [place].  She has had multiple stool studies done of which the only thing that was positive was for some type of inflammation (presume this is the pancreatic insufficiency).   Diarrhea      Prior to Admission medications  Medication Sig Start Date End Date Taking? Authorizing Provider  diphenoxylate -atropine  (LOMOTIL ) 2.5-0.025 MG tablet Take 2 tablets by mouth 3 (three) times daily as needed for diarrhea or loose stools. 06/29/24  Yes Kahleel Fadeley, Selinda, MD  ondansetron  (ZOFRAN -ODT) 4 MG disintegrating tablet Take 1 tablet (4 mg total) by mouth every 8 (eight) hours as needed for vomiting. 06/29/24  Yes Duvid Smalls, Selinda, MD  potassium chloride  SA (KLOR-CON  M) 20 MEQ tablet Take 2 tablets (40 mEq total) by mouth 2 (two) times daily. 06/29/24 07/09/24 Yes Jennings Stirling, Selinda, MD  amLODipine  (NORVASC ) 5 MG tablet TAKE 1 TABLET BY MOUTH EVERY DAY 03/03/18   Dow Arland BROCKS, NP  aspirin EC 81 MG tablet Take 81 mg by mouth daily.    [provider]  atorvastatin (LIPITOR) 20 MG tablet Take 20 mg by mouth every morning.    [provider]  fexofenadine (ALLERGY 24-HR) 180 MG tablet Take 180 mg by mouth  daily.    [provider]  ketotifen (ALAWAY) 0.025 % ophthalmic solution Place 1 drop into both eyes 2 (two) times daily as needed (allergy eyes).     [provider]  milk thistle 175 MG tablet Take 175 mg by mouth daily.    [provider]  Multiple Vitamin (THERA) TABS Take 1 tablet by mouth daily.    [provider]  olmesartan (BENICAR) 20 MG tablet Take 20 mg by mouth daily.    [provider]  pantoprazole (PROTONIX) 20 MG tablet Take 20 mg by mouth daily.    [provider]  psyllium (METAMUCIL) 58.6 % powder Take 10 packets by mouth daily. 2 tablespoons each morning    [provider]    Allergies: Triamterene    Review of Systems  Gastrointestinal:  Positive for diarrhea.    Updated Vital Signs BP 111/70   Pulse 74   Temp 97.8 F (36.6 C) (Oral)   Resp 16   SpO2 100%   Physical Exam Vitals and nursing note reviewed.  Constitutional:      Appearance: He is well-developed.  HENT:     Head: Normocephalic and atraumatic.     Mouth/Throat:     Mouth: Mucous membranes are dry.  Cardiovascular:     Rate and Rhythm: Tachycardia present.  Pulmonary:     Effort: Pulmonary effort is normal. No respiratory distress.  Abdominal:  General: There is no distension.  Musculoskeletal:        General: Normal range of motion.     Cervical back: Normal range of motion.  Neurological:     Mental Status: He is alert.     (all labs ordered are listed, but only abnormal results are displayed) Labs Reviewed  COMPREHENSIVE METABOLIC PANEL WITH GFR - Abnormal; Notable for the following components:      Result Value   Potassium 2.9 (*)    Glucose, Bld 117 (*)    Creatinine, Ser 1.58 (*)    ALT 61 (*)    GFR, Estimated 49 (*)    All other components within normal limits  CBC - Abnormal; Notable for the following components:   WBC 19.7 (*)    Platelets 416 (*)    All other components within normal limits  LIPASE,  BLOOD  URINALYSIS, ROUTINE W REFLEX MICROSCOPIC  MAGNESIUM     EKG: None  Radiology: CT ABDOMEN PELVIS W CONTRAST Result Date: 06/29/2024 EXAM: CT ABDOMEN AND PELVIS WITH CONTRAST 06/29/2024 02:34:50 AM TECHNIQUE: CT of the abdomen and pelvis was performed with the administration of 100 mL of iohexol  (OMNIPAQUE ) 300 MG/ML solution. Multiplanar reformatted images are provided for review. Automated exposure control, iterative reconstruction, and/or weight-based adjustment of the mA/kV was utilized to reduce the radiation dose to as low as reasonably achievable. COMPARISON: None available. CLINICAL HISTORY: Abdominal pain, acute, nonlocalized. Acute, nonlocalized abdominal pain. FINDINGS: LOWER CHEST: There is atelectasis in the lung bases. LIVER: There are cysts in the left lobe of the liver measuring up to 2 cm. GALLBLADDER AND BILE DUCTS: Gallbladder is unremarkable. No biliary ductal dilatation. SPLEEN: No acute abnormality. PANCREAS: No acute abnormality. ADRENAL GLANDS: No acute abnormality. KIDNEYS, URETERS AND BLADDER: No stones in the kidneys or ureters. No hydronephrosis. No perinephric or periureteral stranding. Urinary bladder is unremarkable. GI AND BOWEL: Stomach demonstrates no acute abnormality. There are air fluid levels throughout the colon. There is sigmoid colon diverticulosis. There is some mild wall thickening of the descending colon and sigmoid colon without surrounding inflammation. The appendix is within normal limits. There is no bowel obstruction. PERITONEUM AND RETROPERITONEUM: No ascites. No free air. VASCULATURE: Aorta is normal in caliber. There are atherosclerotic calcifications of the aorta. LYMPH NODES: No lymphadenopathy. REPRODUCTIVE ORGANS: No acute abnormality. BONES AND SOFT TISSUES: T12 and L4 compression deformities are mild and favored as chronic. There are small fat containing bilateral inguinal hernias. No focal soft tissue abnormality. IMPRESSION: 1. Mild wall  thickening of the descending and sigmoid colon without surrounding inflammation, which can be seen with mild colitis. 2. Sigmoid colon diverticulosis without evidence of diverticulitis. 3. Hepatic cysts. Electronically signed by: Greig Pique MD 06/29/2024 03:07 AM EST RP Workstation: [id number]     Procedures   Medications Ordered in the ED  lactated ringers  bolus 1,000 mL (0 mLs Intravenous Stopped 06/29/24 0058)  diphenoxylate -atropine  (LOMOTIL ) 2.5-0.025 MG per tablet 2 tablet (2 tablets Oral Given 06/28/24 2343)  potassium chloride  10 mEq in 100 mL IVPB (0 mEq Intravenous Stopped 06/29/24 0208)  potassium chloride  SA (KLOR-CON  M) CR tablet 40 mEq (40 mEq Oral Given 06/29/24 0106)  lactated ringers  bolus 1,000 mL (0 mLs Intravenous Stopped 06/29/24 0215)  iohexol  (OMNIPAQUE ) 300 MG/ML solution 100 mL (100 mLs Intravenous Contrast Given 06/29/24 0225)  lactated ringers  bolus 1,000 mL (0 mLs Intravenous Stopped 06/29/24 0440)  Medical Decision Making Amount and/or Complexity of Data Reviewed Labs: ordered. Radiology: ordered.  Risk Prescription drug management.   6 weeks plus of symptoms and already following with GI I discussed with him the limitations of the emergency room.  Secondary to the tachycardia, dry mouth and obvious dehydration will check labs and give him some fluids.  Can try Lomotil  as he has never tried that before.  Encouraged him to go back to the probiotics and increase his fiber intake for now.  Patient does have a leukocytosis so we will go ahead and get a CT scan to make sure there is no infectious cause although this is probably related to the inflammatory issue.  He does have an acute kidney injury and hypokalemia as well.  Or get a couple liters of fluid and we will replete his potassium, will need to go home on further supplementation.   CT w/ colitis. Has already had numerous stool studies without obvious bacterial causes and is  scheduled for colonoscopy which will likely show him the cause of the colitis and diarrhea.   Urinated a few times. One episode of diarrhea here. Will try lomotil  at home along with increasing fiber and probiotics. Will fu w/ GI/PCP for repeat K and creatinine check.    Final diagnoses:  Diarrhea, unspecified type  Colitis  Hypokalemia  AKI (acute kidney injury)    ED Discharge Orders          Ordered    diphenoxylate -atropine  (LOMOTIL ) 2.5-0.025 MG tablet  3 times daily PRN        06/29/24 0436    ondansetron  (ZOFRAN -ODT) 4 MG disintegrating tablet  Every 8 hours PRN        06/29/24 0436    potassium chloride  SA (KLOR-CON  M) 20 MEQ tablet  2 times daily        06/29/24 0436               Jasira Robinson, Selinda, MD 06/29/24 9552    Lorette Selinda, MD 06/29/24 [phone]  "
